# Patient Record
Sex: Male | Born: 1975 | Race: White | Hispanic: No | Marital: Single
Health system: Southern US, Community
[De-identification: ages and names within clinical notes are randomized; demographics above are authoritative.]

---

## 2006-10-10 ENCOUNTER — Emergency Department: Payer: Self-pay | Admitting: Emergency Medicine

## 2009-07-10 ENCOUNTER — Ambulatory Visit: Payer: Self-pay | Admitting: Ophthalmology

## 2010-11-07 IMAGING — CR RIGHT HIP - COMPLETE 2+ VIEW
1 series · 2 of 2 positions shown · non-contrast
Comparison: none

REASON FOR EXAM: hip pain
COMMENTS:

PROCEDURE:     DXR - DXR HIP RIGHT COMPLETE  - July 10, 2009  [DATE]
RESULT:     AP and lateral views of the right hip reveal the bones to be
adequately mineralized. I do not see evidence of acute fracture or
dislocation. No significant degenerative changes seen.

[Series 1: view not recorded · 0.17mm/px · 2 of 2 slices shown]
[im 1/2]
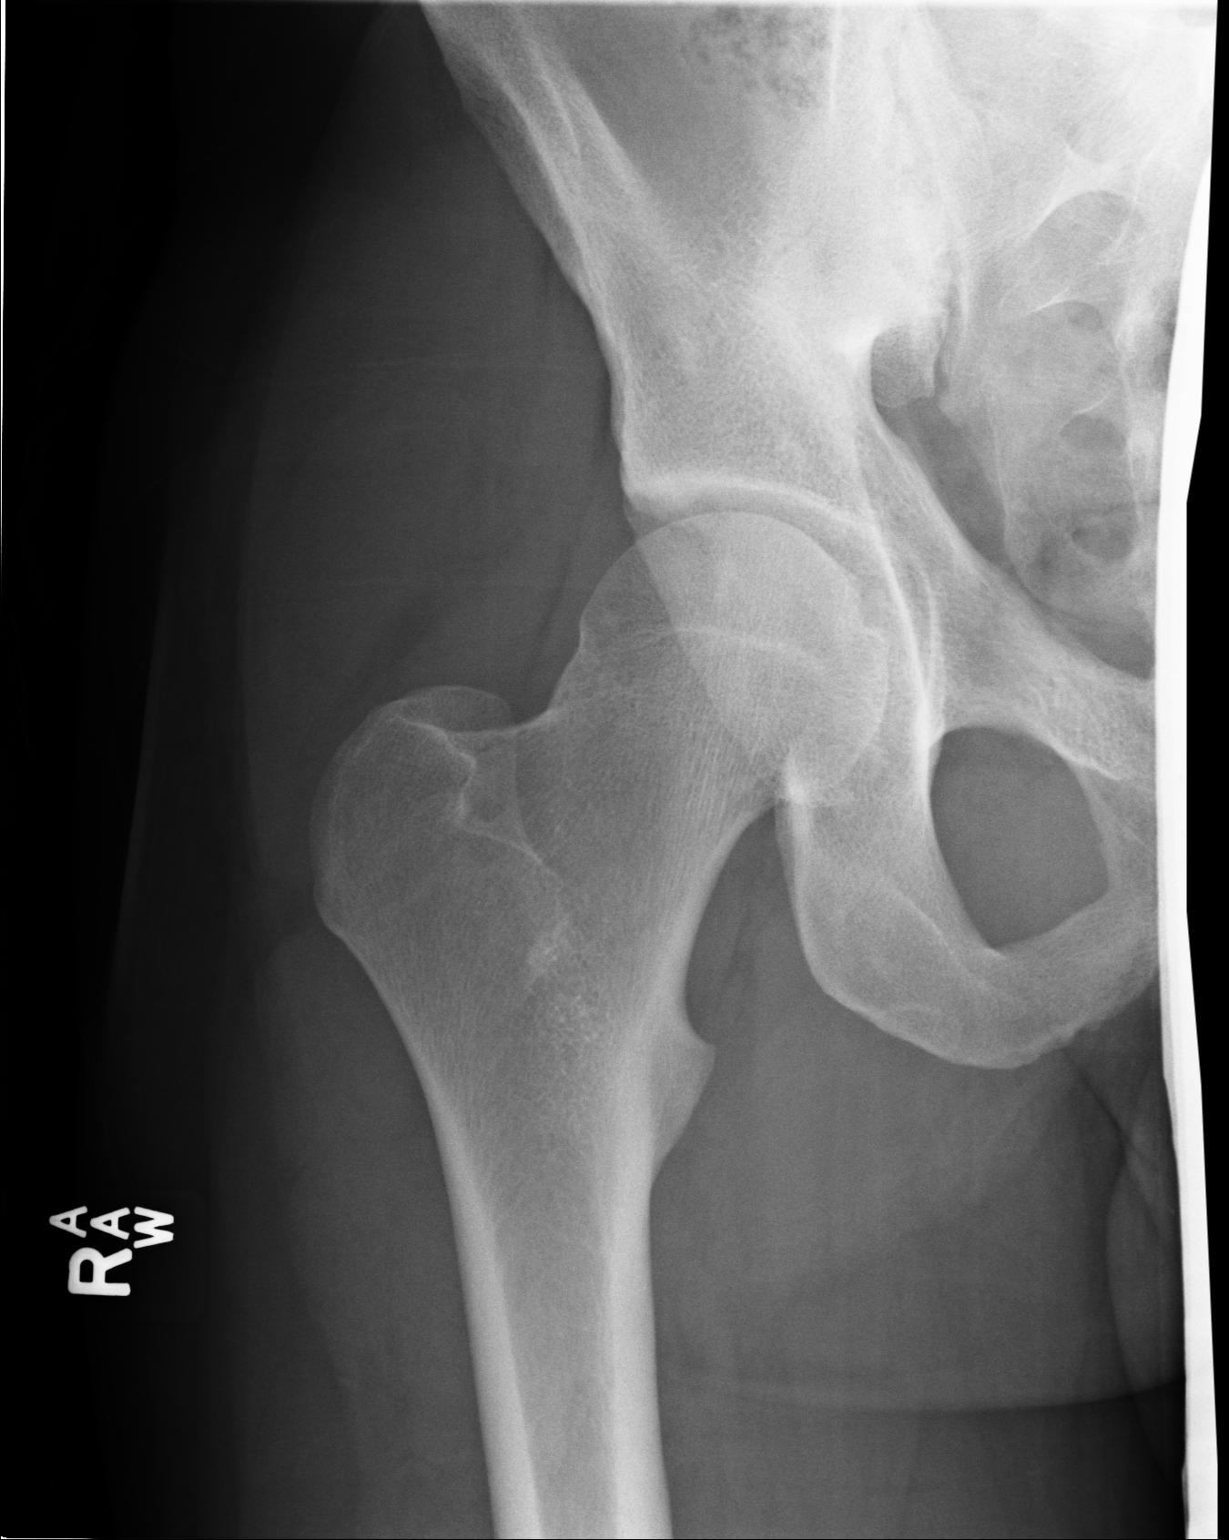
[im 2/2]
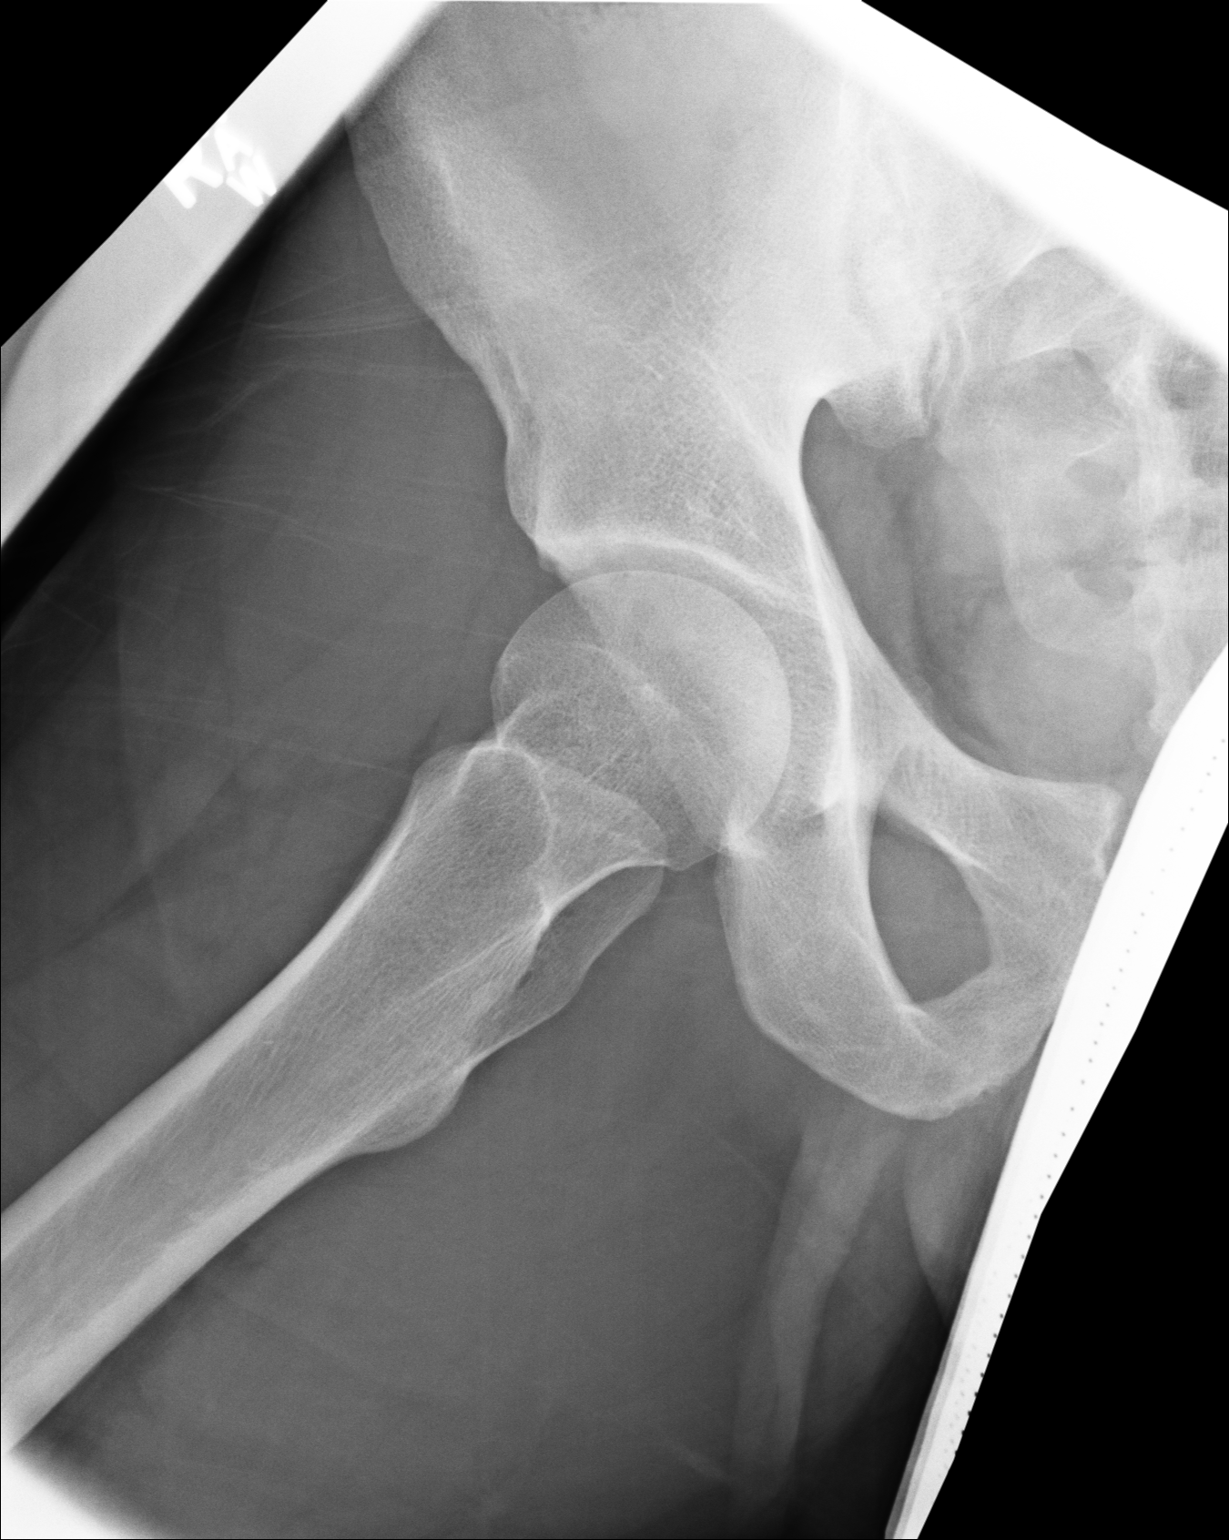

[2 of 2 positions shown; findings below may reference images not displayed]

IMPRESSION: I see no acute bony abnormality of the right hip nor
evidence of significant degenerative change.

## 2022-04-14 ENCOUNTER — Telehealth: Payer: Self-pay | Admitting: *Deleted

## 2022-04-14 ENCOUNTER — Ambulatory Visit: Payer: BLUE CROSS/BLUE SHIELD | Admitting: Podiatry

## 2022-04-14 DIAGNOSIS — M779 Enthesopathy, unspecified: Secondary | ICD-10-CM

## 2022-04-14 DIAGNOSIS — M722 Plantar fascial fibromatosis: Secondary | ICD-10-CM

## 2022-04-14 MED ORDER — MELOXICAM 15 MG PO TABS: 15.0000 mg | ORAL_TABLET | Freq: Every day | ORAL | 0 refills | Status: AC

## 2022-04-14 NOTE — Patient Instructions (Signed)

## 2022-04-14 NOTE — Addendum Note (Signed)
Addended by: Ames Coupe F on: 04/14/2022 05:20 PM   Modules accepted: Orders

## 2022-04-14 NOTE — Telephone Encounter (Signed)
Patient is calling for status of a medication that was supposed to be sent to pharmacy on file,please advise.

## 2022-04-14 NOTE — Progress Notes (Signed)
  Subjective:  Patient ID: Kenneth Mora, male    DOB: 08-Sep-1975,  MRN: [id number]  CC: Right heel pain  47 y.o. male presents with the above complaint. Patient presents for new onset right heel pain. Had plantar fasciitis 10-15 yrs ago, saw dr. Amalia Hailey in [place]. Says pain went away until recently. Now having pain with first steps out of bed in the AM. Pain worse when walking standing long periods of time.    Review of Systems: Negative except as noted in the HPI. Denies N/V/F/Ch.   Objective:  There were no vitals filed for this visit. There is no height or weight on file to calculate BMI. Constitutional Well developed. Well nourished.  Vascular Dorsalis pedis pulses palpable bilaterally. Posterior tibial pulses palpable bilaterally. Capillary refill normal to all digits.  No cyanosis or clubbing noted. Pedal hair growth normal.  Neurologic Normal speech. Oriented to person, place, and time. Epicritic sensation to light touch grossly present bilaterally.  Dermatologic Nails well groomed and normal in appearance. No open wounds. No skin lesions.  Orthopedic: Normal joint ROM without pain or crepitus bilaterally. No visible deformities. Tender to palpation at the calcaneal tuber right. No pain with calcaneal squeeze right. Ankle ROM diminished range of motion right. Silfverskiold Test: negative right.   Radiographs: Taken and reviewed. No acute fractures or dislocations. No evidence of stress fracture.  Plantar heel spur present. Posterior heel spur absent.   Assessment:   1. Tendonitis   2. Plantar fasciitis of right foot    Plan:  Patient was evaluated and treated and all questions answered.  Plantar Fasciitis, right - XR reviewed as above.  - Educated on icing and stretching. Instructions given.  - Injection delivered to the plantar fascia as below. - DME: Powersteps dispensed, instructed on use - Pharmacologic management: Meloxicam. Educated on risks/benefits  and proper taking of medication.  Procedure: Injection Tendon/Ligament Location: Right plantar fascia at the glabrous junction; medial approach. Skin Prep: alcohol Injectate: 1 cc 0.5% marcaine plain, 1 cc kenalog 10. Disposition: Patient tolerated procedure well. Injection site dressed with a band-aid.  Return in about 4 weeks (around 05/12/2022) for F/u R plantar fasciitis.

## 2022-08-15 ENCOUNTER — Ambulatory Visit: Payer: Commercial Managed Care - PPO | Admitting: Podiatry

## 2022-08-15 ENCOUNTER — Ambulatory Visit: Payer: Commercial Managed Care - PPO

## 2022-08-15 ENCOUNTER — Encounter: Payer: Self-pay | Admitting: Podiatry

## 2022-08-15 DIAGNOSIS — M7751 Other enthesopathy of right foot: Secondary | ICD-10-CM | POA: Diagnosis not present

## 2022-08-15 DIAGNOSIS — M778 Other enthesopathies, not elsewhere classified: Secondary | ICD-10-CM

## 2022-08-15 MED ORDER — METHYLPREDNISOLONE 4 MG PO TBPK: ORAL_TABLET | ORAL | 0 refills | Status: AC

## 2022-08-15 MED ORDER — TRIAMCINOLONE ACETONIDE 40 MG/ML IJ SUSP
20.0000 mg | Freq: Once | INTRAMUSCULAR | Status: AC
  Administered 2022-08-15: 20 mg

## 2022-08-15 NOTE — Progress Notes (Signed)
Presents today for chief complaint of pain to the forefoot right.  Has seen Dr. Annamary Rummage back in January who diagnosed him with Planter fasciitis injected him started him on methylprednisolone.  States that the heel seems to be doing some better though it did take some time.  He has been wearing the power step insoles that he was dispensed at that time.  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  Objective: Vital signs are stable he is alert and oriented x 3 pulses are palpable bilateral neurologic sensorium is intact deep tendon reflexes are intact muscle strength is normal and symmetrical.  He does have tenderness on end range of motion of the second metatarsophalangeal joint with some overlying swelling of that joint.  Assessment: Capsulitis synovitis bursitis of the second metatarsophalangeal joint right foot.  Plan: Discussed etiology pathology conservative surgical therapies started him on methylprednisolone to be followed by meloxicam.  Discussed appropriate shoe gear stretching exercise ice therapy shoe gear modifications.  Injected around the joint today with 10 mg Kenalog 5 mg Marcaine point of maximal tenderness.  I will follow-up with him in 4 to 6 weeks at which time we may need to consider orthotic management.

## 2022-09-14 ENCOUNTER — Ambulatory Visit: Payer: Commercial Managed Care - PPO | Admitting: Podiatry
